# Patient Record
Sex: Male | Born: 1995 | Race: White | Hispanic: No | Marital: Single | Smoking: Never smoker
Health system: Southern US, Community
[De-identification: ages and names within clinical notes are randomized; demographics above are authoritative.]

---
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
## 2016-01-12 ENCOUNTER — Emergency Department (HOSPITAL_COMMUNITY): Payer: Self-pay

## 2016-01-12 ENCOUNTER — Encounter (HOSPITAL_COMMUNITY): Payer: Self-pay | Admitting: Emergency Medicine

## 2016-01-12 ENCOUNTER — Emergency Department (HOSPITAL_COMMUNITY)
Admission: EM | Admit: 2016-01-12 | Discharge: 2016-01-12 | Disposition: A | Discharge status: Home / Self Care | Payer: Self-pay | Attending: Emergency Medicine | Admitting: Emergency Medicine

## 2016-01-12 DIAGNOSIS — S93402A Sprain of unspecified ligament of left ankle, initial encounter: Secondary | ICD-10-CM | POA: Insufficient documentation

## 2016-01-12 DIAGNOSIS — Y929 Unspecified place or not applicable: Secondary | ICD-10-CM | POA: Insufficient documentation

## 2016-01-12 DIAGNOSIS — W1839XA Other fall on same level, initial encounter: Secondary | ICD-10-CM | POA: Insufficient documentation

## 2016-01-12 DIAGNOSIS — Y998 Other external cause status: Secondary | ICD-10-CM | POA: Insufficient documentation

## 2016-01-12 DIAGNOSIS — Y9361 Activity, american tackle football: Secondary | ICD-10-CM | POA: Insufficient documentation

## 2016-01-12 MED ORDER — IBUPROFEN 800 MG PO TABS: 800.0000 mg | ORAL_TABLET | Freq: Three times a day (TID) | ORAL | 0 refills | Status: DC

## 2016-01-12 NOTE — Discharge Instructions (Signed)
Elevate, apply ice packs on/off to your ankle.  Use your crutches for weight bearing for at least one week.  Call the orthopedics office later this week to follow-up

## 2016-01-12 NOTE — ED Notes (Signed)
Ice back applied

## 2016-01-12 NOTE — ED Provider Notes (Signed)
[facility]-EMERGENCY DEPT Provider Note   CSN: [id number] Arrival date & time: 01/12/16  1522 By signing my name below, I, Larry Ryan, attest that this documentation has been prepared under the direction and in the presence of non-physician practitioner, Pauline Ausammy Kawan [place], PA-C. Electronically Signed: Levon HedgerElizabeth Ryan, Scribe. 01/12/2016. 4:49 PM.   History   Chief Complaint Chief Complaint  Patient presents with  . Ankle Pain   HPI Larry Ryan is an otherwise healthy 20 y.o. male who presents to the Emergency Department complaining of sudden onset, constant left ankle pain s.p twisting it this afternoon. Pt states he stepped in a hole while playing football which caused him to twist his ankle and fall. He also notes associated sudden onset, significant swelling to the area. No treatments tried PTA. No other injuries sustained. Pt denies any numbness, weakness of the extremity, calf pain or swelling or open wounds. Pain is worse with weight bearing, improves somewhat at rest.    The history is provided by the patient. No language interpreter was used.    History reviewed. No pertinent past medical history.  There are no active problems to display for this patient.  History reviewed. No pertinent surgical history.   Home Medications    Prior to Admission medications   Not on File    Family History No family history on file.  Social History Social History  Substance Use Topics  . Smoking status: Never Smoker  . Smokeless tobacco: Never Used  . Alcohol use Yes   Allergies   Patient has no known allergies.  Review of Systems Review of Systems  Constitutional: Negative for chills and fever.  Musculoskeletal: Positive for arthralgias (left ankle pain and swelling) and joint swelling.  Skin: Negative for color change and wound.  Neurological: Negative for dizziness, weakness and numbness.  All other systems reviewed and are negative.        Physical Exam Updated  Vital Signs BP 156/77 (BP Location: Left Arm)   Pulse 90   Temp 98.4 F (36.9 C) (Oral)   Resp 16   Ht 6' (1.829 m)   Wt 215 lb (97.5 kg)   SpO2 100%   BMI 29.16 kg/m   Physical Exam  Constitutional: He is oriented to person, place, and time. He appears well-developed and well-nourished. No distress.  HENT:  Head: Normocephalic and atraumatic.  Eyes: Conjunctivae are normal.  Cardiovascular: Normal rate and regular rhythm.   Pulmonary/Chest: Effort normal.  Musculoskeletal: He exhibits edema and tenderness. He exhibits no deformity.  Tenderness with moderate edema of the lateral left ankle. No proximal tenderness. Compartments soft.   Neurological: He is alert and oriented to person, place, and time.  Skin: Skin is warm and dry.  Psychiatric: He has a normal mood and affect.  Nursing note and vitals reviewed.  ED Treatments / Results  DIAGNOSTIC STUDIES:  Oxygen Saturation is 100% on RA, normal by my interpretation.    COORDINATION OF CARE:  4:48 PM Discussed treatment plan with pt at bedside and pt agreed to plan. Pt declined pain medication.  Labs (all labs ordered are listed, but only abnormal results are displayed) Labs Reviewed - No data to display  EKG  EKG Interpretation None       Radiology Dg Ankle Complete Left  Result Date: 01/12/2016 CLINICAL DATA:  Twisted ankle playing football yesterday. Left ankle pain and swelling. Initial encounter. EXAM: LEFT ANKLE COMPLETE - 3+ VIEW COMPARISON:  09/22/2012 FINDINGS: There is no evidence of acute fracture  or dislocation. There is no evidence of arthropathy or other focal bone abnormality. Prominent lateral and anterior soft tissue swelling seen as well as ankle joint effusion. IMPRESSION: Soft tissue swelling and ankle joint effusion. No acute fracture or dislocation. Electronically Signed   By: Myles RosenthalJohn  Stahl M.D.   On: 01/12/2016 16:29    Procedures Procedures (including critical care time)  Medications Ordered  in ED Medications - No data to display   Initial Impression / Assessment and Plan / ED Course  I have reviewed the triage vital signs and the nursing notes.  Pertinent labs & imaging results that were available during my care of the patient were reviewed by me and considered in my medical decision making (see chart for details).  Clinical Course     XR neg for fx.  Discussed possibility of occult fx not seen due to amt of edema.  Pt agrees to RICE therapy, has crutches at home.  Agrees to close orthopedic f/u. Rx Ibuprofen and hydrocodone for pain.    ASO applied, pain improved, remains NV intact  Final Clinical Impressions(s) / ED Diagnoses   Final diagnoses:  Sprain of left ankle, unspecified ligament, initial encounter    New Prescriptions New Prescriptions   No medications on file   I personally performed the services described in this documentation, which was scribed in my presence. The recorded information has been reviewed and is accurate.    Pauline Ausammy Miley Blanchett, PA-C 01/13/16 0031    Marily MemosJason Mesner, MD 01/15/16 [phone]

## 2016-01-12 NOTE — ED Triage Notes (Signed)
Pt c/o left ankle pain. Reports turning it while playing football today.

## 2016-01-15 ENCOUNTER — Ambulatory Visit (INDEPENDENT_AMBULATORY_CARE_PROVIDER_SITE_OTHER): Payer: Self-pay | Admitting: Orthopaedic Surgery

## 2016-01-15 ENCOUNTER — Encounter: Payer: Self-pay | Admitting: Orthopaedic Surgery

## 2016-01-15 VITALS — BP 149/88 | HR 83 | Temp 98.4°F | Ht 73.0 in | Wt 210.0 lb

## 2016-01-15 DIAGNOSIS — S96912A Strain of unspecified muscle and tendon at ankle and foot level, left foot, initial encounter: Secondary | ICD-10-CM

## 2016-01-15 MED ORDER — NAPROXEN 500 MG PO TABS: 500.0000 mg | ORAL_TABLET | Freq: Two times a day (BID) | ORAL | 5 refills | Status: AC

## 2016-01-15 MED ORDER — HYDROCODONE-ACETAMINOPHEN 5-325 MG PO TABS: 1.0000 | ORAL_TABLET | ORAL | 0 refills | Status: AC | PRN

## 2016-01-15 NOTE — Patient Instructions (Signed)
Contrast Bath  Prepare the baths.  Cold = 55-65 degrees     Hot = 105-110 degrees  Starting with the hot, dip hand or foot all the way into the water and hold there for selected duration.  Preferably 3 minutes.  After selected duration is up, dip hand or foot into the cold for 1/3 duration of the hot. (3 minutes hot, 1 minute cold)  Alternate back and forth for the times indicated for no more than a total of 20 minutes ending with hot.   

## 2016-01-15 NOTE — Progress Notes (Signed)
Subjective:    Patient ID: Larry Ryan, male    DOB: 01/12/1996, 20 y.o.   MRN: [id number]  HPI  He was playing football and stepped in a hole and hurt his left ankle and foot.  He was seen in ER on 01-12-16, day of the injury.  X-rays were negative for fracture.  He was given an ankle brace and crutches and ibuprofen and Vicodin.  He has no other injury.  He has more swelling of the foot now and less of the ankle.      Review of Systems  HENT: Negative for congestion.   Respiratory: Negative for cough and shortness of breath.   Cardiovascular: Negative for chest pain and leg swelling.  Endocrine: Negative for cold intolerance.  Musculoskeletal: Positive for arthralgias, gait problem and joint swelling.  Allergic/Immunologic: Negative for environmental allergies.   History reviewed. No pertinent past medical history.  History reviewed. No pertinent surgical history.  No current outpatient prescriptions on file prior to visit.   No current facility-administered medications on file prior to visit.     Social History   Social History  . Marital status: Single    Spouse name: N/A  . Number of children: N/A  . Years of education: N/A   Occupational History  . Not on file.   Social History Main Topics  . Smoking status: Never Smoker  . Smokeless tobacco: Never Used  . Alcohol use Yes  . Drug use: No  . Sexual activity: Not on file   Other Topics Concern  . Not on file   Social History Narrative  . No narrative on file    Family History  Problem Relation Age of Onset  . Anesthesia problems Neg Hx   . Broken bones Neg Hx   . Cancer Neg Hx   . Clotting disorder Neg Hx   . Collagen disease Neg Hx   . Diabetes Neg Hx   . Dislocations Neg Hx   . Osteoporosis Neg Hx   . Rheumatologic disease Neg Hx   . Scoliosis Neg Hx   . Severe sprains Neg Hx     BP (!) 149/88   Pulse 83   Temp 98.4 F (36.9 C)   Ht 6\' 1"  (1.854 m)   Wt 210 lb (95.3 kg)   BMI 27.71  kg/m      Objective:   Physical Exam  Constitutional: He is oriented to person, place, and time. He appears well-developed and well-nourished.  HENT:  Head: Normocephalic and atraumatic.  Eyes: Conjunctivae and EOM are normal. Pupils are equal, round, and reactive to light.  Neck: Normal range of motion. Neck supple.  Cardiovascular: Normal rate, regular rhythm and intact distal pulses.   Pulmonary/Chest: Effort normal.  Abdominal: Soft.  Musculoskeletal: He exhibits tenderness (He has significant swelling of the left ankle and foot. NV intact. ROM is full.  Right ankle negative.  On crutches.).  Neurological: He is alert and oriented to person, place, and time. He has normal reflexes. He displays normal reflexes. No cranial nerve deficit. He exhibits normal muscle tone. Coordination normal.  Skin: Skin is warm and dry.  Psychiatric: He has a normal mood and affect. His behavior is normal. Judgment and thought content normal.          Assessment & Plan:   Encounter Diagnosis  Name Primary?  . Left ankle strain, initial encounter Yes   He is given a CAM walker and instructions for Contrast Baths.  Elevate  and Ice.  Rx for Naprosyn and Norco.  Return in two weeks.  Call if any problem.  Precautions discussed.  Electronically Signed Darreld McleanWayne Clavin Ruhlman, MD 12/6/20172:32 PM

## 2016-01-30 ENCOUNTER — Ambulatory Visit: Payer: Self-pay | Admitting: Orthopaedic Surgery

## 2018-06-15 IMAGING — DX DG ANKLE COMPLETE 3+V*L*
3 series · 3 of 3 positions shown · non-contrast
Comparison: 09/22/2012

CLINICAL DATA: Twisted ankle playing football yesterday. Left ankle
pain and swelling. Initial encounter.

EXAM:
LEFT ANKLE COMPLETE - 3+ VIEW

[ankle ap]
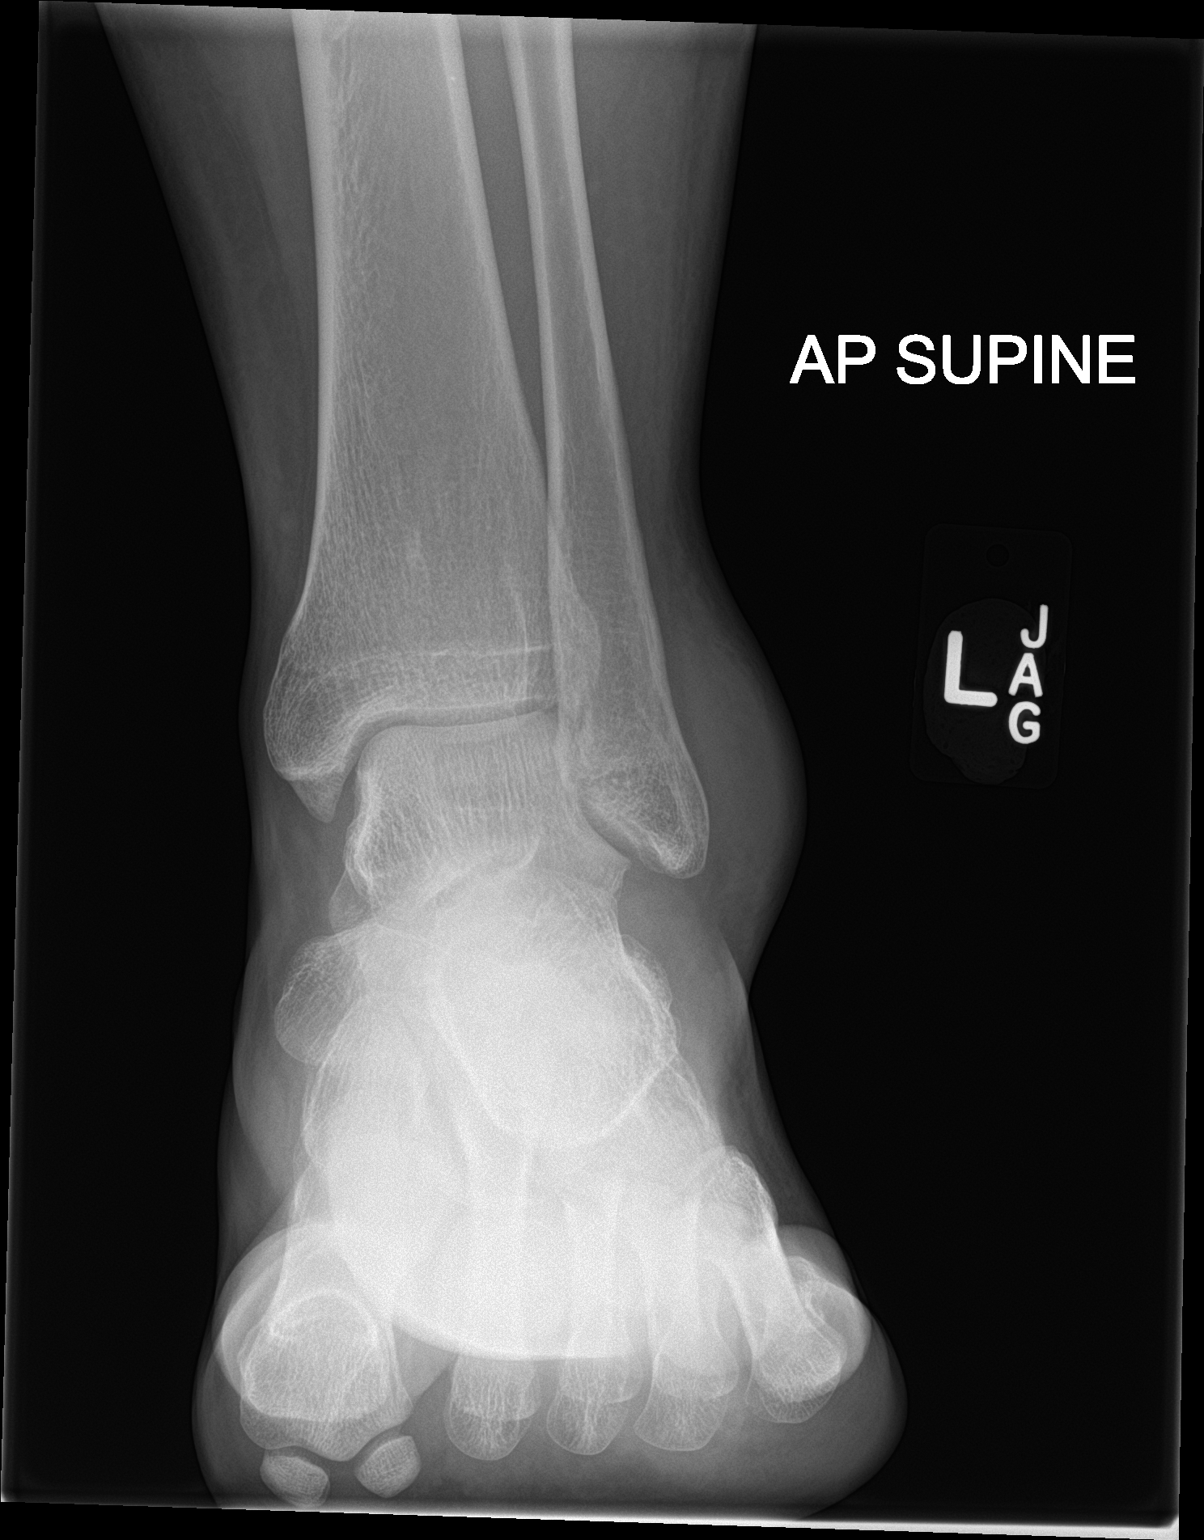

[ankle obl]
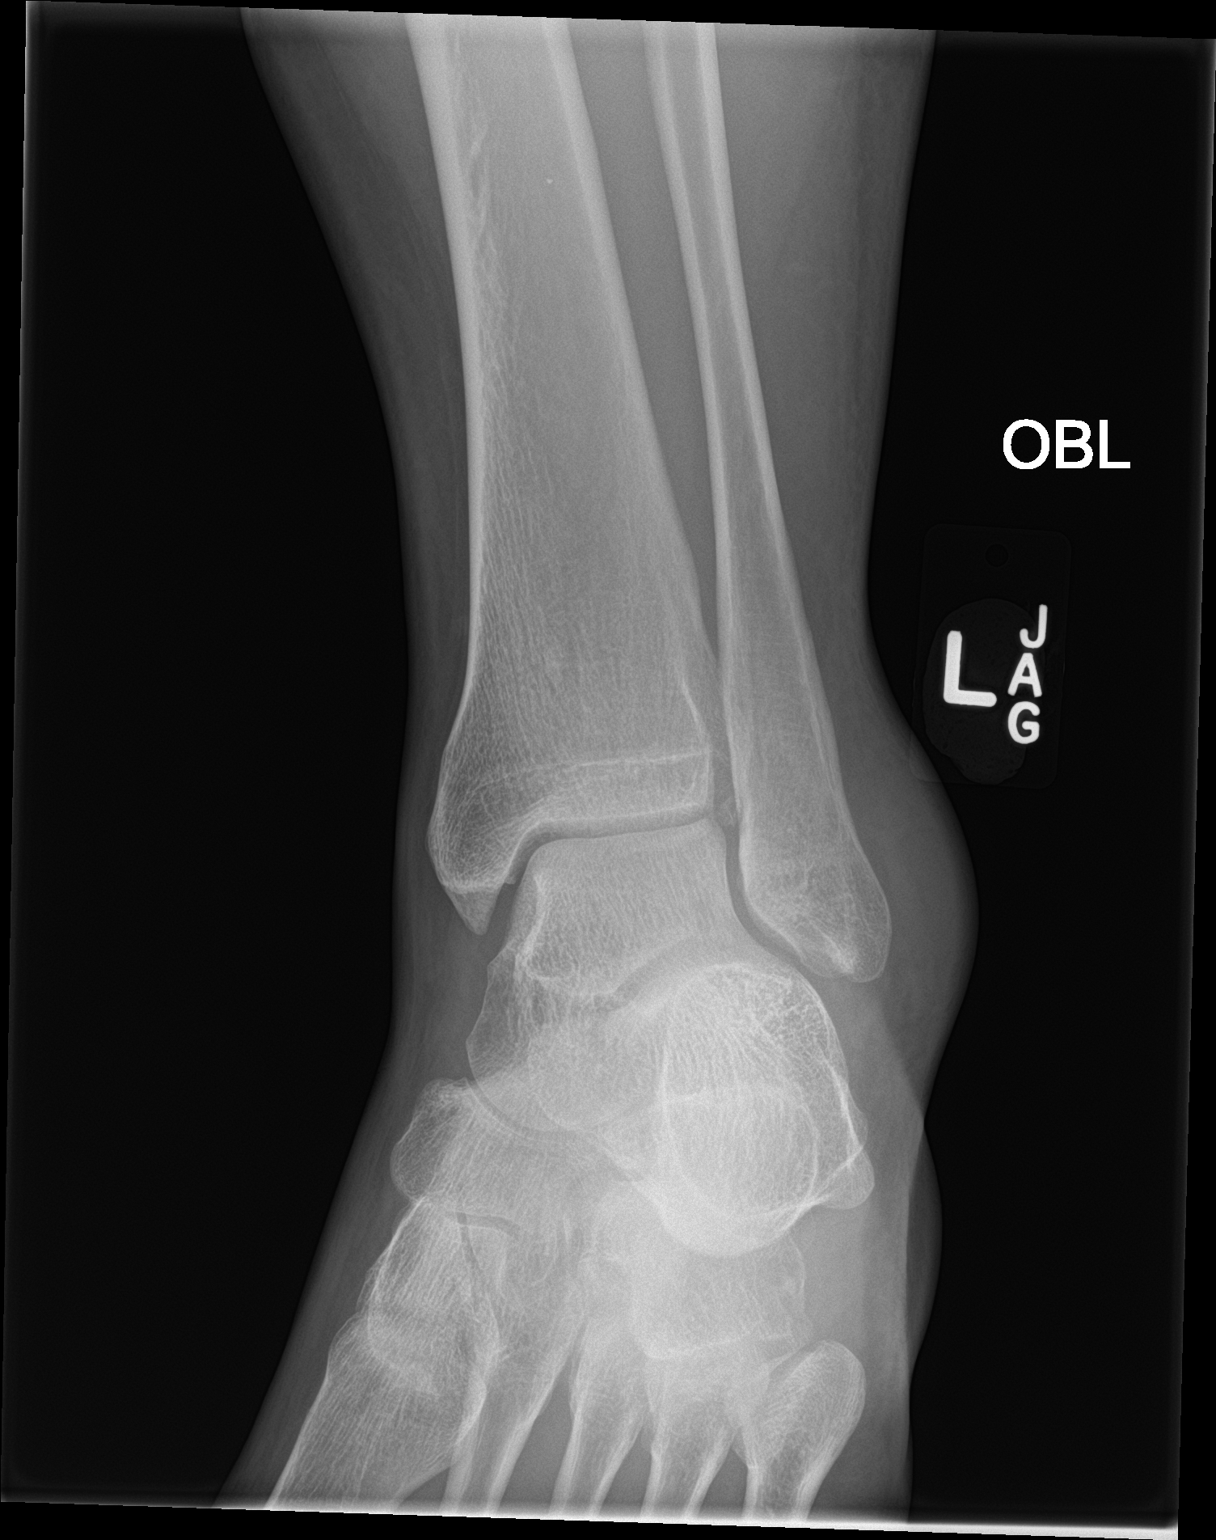

[ankle lat]
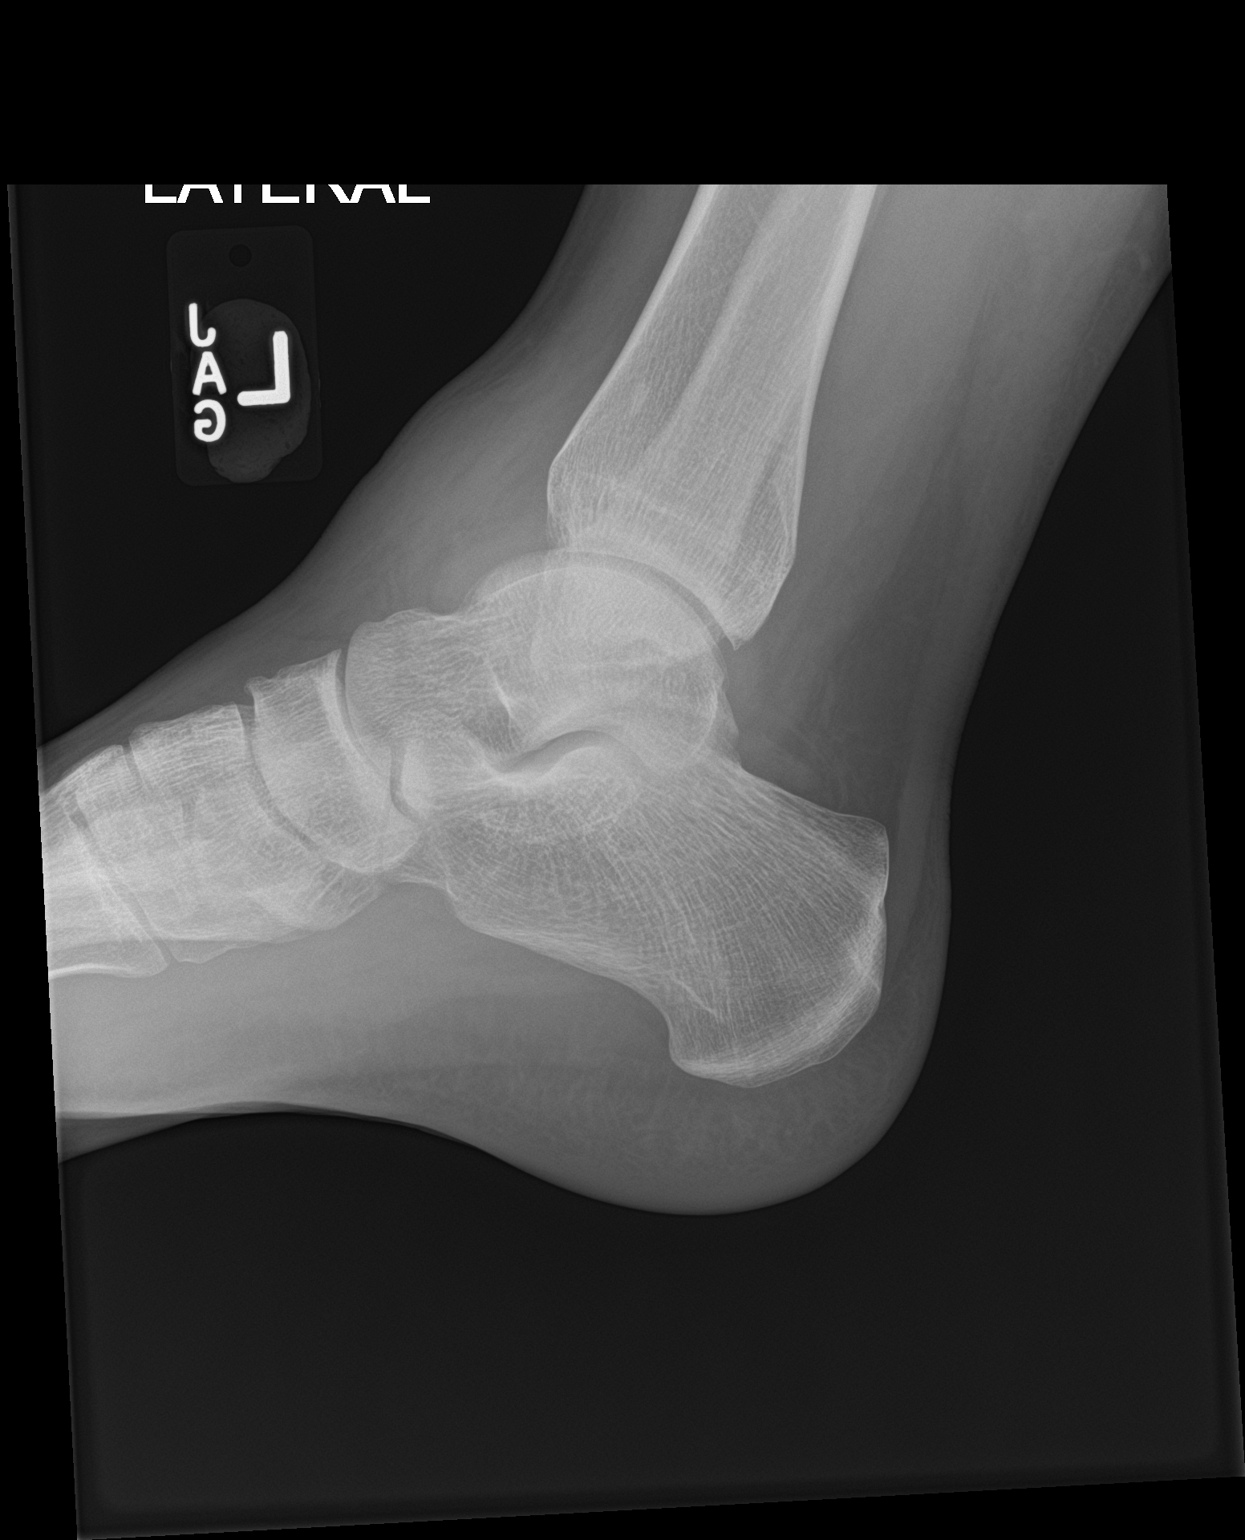

[3 of 3 positions shown; findings below may reference images not displayed]

FINDINGS: There is no evidence of acute fracture or dislocation. There is no
evidence of arthropathy or other focal bone abnormality. Prominent
lateral and anterior soft tissue swelling seen as well as ankle
joint effusion.
IMPRESSION: Soft tissue swelling and ankle joint effusion. No acute fracture or
dislocation.

## 2021-02-08 ENCOUNTER — Other Ambulatory Visit: Payer: Self-pay

## 2021-02-08 ENCOUNTER — Emergency Department (HOSPITAL_COMMUNITY)
Admission: EM | Admit: 2021-02-08 | Discharge: 2021-02-08 | Disposition: A | Discharge status: Home / Self Care | Payer: Self-pay | Attending: Emergency Medicine | Admitting: Emergency Medicine

## 2021-02-08 ENCOUNTER — Encounter (HOSPITAL_COMMUNITY): Payer: Self-pay | Admitting: Emergency Medicine

## 2021-02-08 DIAGNOSIS — Y92410 Unspecified street and highway as the place of occurrence of the external cause: Secondary | ICD-10-CM | POA: Insufficient documentation

## 2021-02-08 DIAGNOSIS — F17220 Nicotine dependence, chewing tobacco, uncomplicated: Secondary | ICD-10-CM | POA: Insufficient documentation

## 2021-02-08 DIAGNOSIS — S01511A Laceration without foreign body of lip, initial encounter: Secondary | ICD-10-CM | POA: Insufficient documentation

## 2021-02-08 MED ORDER — LIDOCAINE-EPINEPHRINE (PF) 2 %-1:200000 IJ SOLN
INTRAMUSCULAR | Status: AC
  Administered 2021-02-08: 20 mL
  Filled 2021-02-08: qty 20

## 2021-02-08 MED ORDER — CEPHALEXIN 500 MG PO CAPS: 500.0000 mg | ORAL_CAPSULE | Freq: Three times a day (TID) | ORAL | 0 refills | Status: AC

## 2021-02-08 MED ORDER — CEPHALEXIN 500 MG PO CAPS
500.0000 mg | ORAL_CAPSULE | Freq: Once | ORAL | Status: DC
  Filled 2021-02-08: qty 1

## 2021-02-08 MED ORDER — CEPHALEXIN 500 MG PO CAPS
500.0000 mg | ORAL_CAPSULE | ORAL | Status: AC
  Administered 2021-02-08: 500 mg via ORAL
  Filled 2021-02-08: qty 1

## 2021-02-08 MED ORDER — LIDOCAINE-EPINEPHRINE 2 %-1:100000 IJ SOLN
20.0000 mL | Freq: Once | INTRAMUSCULAR | Status: DC
  Filled 2021-02-08: qty 20

## 2021-02-08 NOTE — Discharge Instructions (Signed)
The stitches will go away by themselves, they will dissolve over the next couple of weeks.  Please avoid stretching her lips or opening her mouth wide, you should drink out of a straw for the next several days and probably for the full next week.  If you develop increasing pain swelling fever pus drainage or swelling you should return immediately for a recheck.  Take cephalexin as prescribed to help prevent infection.  See your doctor in 1 week if you are still having pain or any other complications or concerns but emergency department for severe or worsening symptoms
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
